# Patient Record
Sex: Male | Born: 2000 | Race: Black or African American | Hispanic: No | Marital: Single | Smoking: Never smoker
Health system: Southern US, Community
[De-identification: ages and names within clinical notes are randomized; demographics above are authoritative.]

## PROBLEM LIST (undated history)

## (undated) DIAGNOSIS — L309 Dermatitis, unspecified: Secondary | ICD-10-CM

## (undated) DIAGNOSIS — S83249A Other tear of medial meniscus, current injury, unspecified knee, initial encounter: Secondary | ICD-10-CM

## (undated) DIAGNOSIS — S83289A Other tear of lateral meniscus, current injury, unspecified knee, initial encounter: Secondary | ICD-10-CM

---
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
## 2019-12-02 ENCOUNTER — Encounter (HOSPITAL_BASED_OUTPATIENT_CLINIC_OR_DEPARTMENT_OTHER): Payer: Self-pay

## 2019-12-02 ENCOUNTER — Emergency Department (HOSPITAL_BASED_OUTPATIENT_CLINIC_OR_DEPARTMENT_OTHER)
Admission: EM | Admit: 2019-12-02 | Discharge: 2019-12-02 | Disposition: A | Discharge status: Home / Self Care | Payer: Federal, State, Local not specified - PPO | Attending: Emergency Medicine | Admitting: Emergency Medicine

## 2019-12-02 ENCOUNTER — Other Ambulatory Visit: Payer: Self-pay

## 2019-12-02 DIAGNOSIS — Y999 Unspecified external cause status: Secondary | ICD-10-CM | POA: Diagnosis not present

## 2019-12-02 DIAGNOSIS — Y92410 Unspecified street and highway as the place of occurrence of the external cause: Secondary | ICD-10-CM | POA: Diagnosis not present

## 2019-12-02 DIAGNOSIS — Z043 Encounter for examination and observation following other accident: Secondary | ICD-10-CM | POA: Insufficient documentation

## 2019-12-02 DIAGNOSIS — Y9389 Activity, other specified: Secondary | ICD-10-CM | POA: Diagnosis not present
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
## 2021-04-28 ENCOUNTER — Emergency Department (HOSPITAL_BASED_OUTPATIENT_CLINIC_OR_DEPARTMENT_OTHER): Payer: Federal, State, Local not specified - PPO

## 2021-04-28 ENCOUNTER — Other Ambulatory Visit: Payer: Self-pay

## 2021-04-28 ENCOUNTER — Encounter (HOSPITAL_BASED_OUTPATIENT_CLINIC_OR_DEPARTMENT_OTHER): Payer: Self-pay

## 2021-04-28 ENCOUNTER — Emergency Department (HOSPITAL_BASED_OUTPATIENT_CLINIC_OR_DEPARTMENT_OTHER)
Admission: EM | Admit: 2021-04-28 | Discharge: 2021-04-28 | Disposition: A | Discharge status: Home / Self Care | Payer: Federal, State, Local not specified - PPO | Attending: Emergency Medicine | Admitting: Emergency Medicine

## 2021-04-28 DIAGNOSIS — K5909 Other constipation: Secondary | ICD-10-CM | POA: Insufficient documentation

## 2021-04-28 DIAGNOSIS — R1012 Left upper quadrant pain: Secondary | ICD-10-CM | POA: Diagnosis not present

## 2021-04-28 DIAGNOSIS — R7401 Elevation of levels of liver transaminase levels: Secondary | ICD-10-CM | POA: Insufficient documentation

## 2021-04-28 DIAGNOSIS — R1013 Epigastric pain: Secondary | ICD-10-CM

## 2021-04-28 DIAGNOSIS — R7989 Other specified abnormal findings of blood chemistry: Secondary | ICD-10-CM | POA: Insufficient documentation

## 2021-04-28 LAB — URINALYSIS, ROUTINE W REFLEX MICROSCOPIC
Bilirubin Urine: NEGATIVE
Glucose, UA: NEGATIVE mg/dL
Hgb urine dipstick: NEGATIVE
Ketones, ur: NEGATIVE mg/dL
Leukocytes,Ua: NEGATIVE
Nitrite: NEGATIVE
Protein, ur: NEGATIVE mg/dL
Specific Gravity, Urine: >=1.03 (ref 1.005–1.030)
pH: 6 (ref 5.0–8.0)

## 2021-04-28 LAB — COMPREHENSIVE METABOLIC PANEL
ALT: 45 U/L — ABNORMAL HIGH (ref 0–44)
AST: 112 U/L — ABNORMAL HIGH (ref 15–41)
Albumin: 4.4 g/dL (ref 3.5–5.0)
Alkaline Phosphatase: 61 U/L (ref 38–126)
Anion gap: 8 (ref 5–15)
BUN: 27 mg/dL — ABNORMAL HIGH (ref 6–20)
CO2: 26 mmol/L (ref 22–32)
Calcium: 9.2 mg/dL (ref 8.9–10.3)
Chloride: 103 mmol/L (ref 98–111)
Creatinine, Ser: 1.36 mg/dL — ABNORMAL HIGH (ref 0.61–1.24)
GFR, Estimated: >60 mL/min (ref >60)
Glucose, Bld: 90 mg/dL (ref 70–99)
Potassium: 3.8 mmol/L (ref 3.5–5.1)
Sodium: 137 mmol/L (ref 135–145)
Total Bilirubin: 0.6 mg/dL (ref 0.3–1.2)
Total Protein: 7.5 g/dL (ref 6.5–8.1)

## 2021-04-28 LAB — LIPASE, BLOOD: Lipase: 44 U/L (ref 11–51)

## 2021-04-28 LAB — CBC
HCT: 42.5 % (ref 39.0–52.0)
Hemoglobin: 14 g/dL (ref 13.0–17.0)
MCH: 27.6 pg (ref 26.0–34.0)
MCHC: 32.9 g/dL (ref 30.0–36.0)
MCV: 83.7 fL (ref 80.0–100.0)
Platelets: 289 10*3/uL (ref 150–400)
RBC: 5.08 MIL/uL (ref 4.22–5.81)
RDW: 13.9 % (ref 11.5–15.5)
WBC: 8.2 10*3/uL (ref 4.0–10.5)
nRBC: 0 % (ref 0.0–0.2)

## 2021-04-28 MED ORDER — SUCRALFATE 1 G PO TABS: 1.0000 g | ORAL_TABLET | Freq: Three times a day (TID) | ORAL | 0 refills | Status: AC

## 2021-04-28 MED ORDER — OMEPRAZOLE 20 MG PO CPDR: 20.0000 mg | DELAYED_RELEASE_CAPSULE | Freq: Every day | ORAL | 0 refills | Status: AC

## 2021-04-28 MED ORDER — IOHEXOL 300 MG/ML  SOLN
100.0000 mL | Freq: Once | INTRAMUSCULAR | Status: AC | PRN
  Administered 2021-04-28: 22:00:00 100 mL via INTRAVENOUS

## 2021-04-28 NOTE — ED Triage Notes (Signed)
Pt c/o abd pain, stool "that looks like crushed up styrofoam" x 10 months-NAD-steady gait

## 2021-04-28 NOTE — ED Provider Notes (Signed)
[facility] EMERGENCY DEPARTMENT Provider Note   CSN: [id number] Arrival date & time: 04/28/21  1842     History Chief Complaint  Patient presents with   Abdominal Pain    Hunter Casey is a 20 y.o. male.  The history is provided by the patient and medical records. No language interpreter was used.  Abdominal Pain Pain location:  Epigastric and LUQ Pain quality: aching   Pain radiates to:  Epigastric region Pain severity:  Severe Onset quality:  Gradual Timing:  Intermittent Progression:  Waxing and waning Chronicity:  Chronic Context: not previous surgeries and not recent illness   Relieved by:  Nothing Worsened by:  Movement Associated symptoms: constipation   Associated symptoms: no anorexia, no chest pain, no chills, no cough, no diarrhea, no dysuria, no fatigue, no fever, no flatus, no nausea, no shortness of breath and no vomiting       Past Medical History:  Diagnosis Date   Eczema    Lateral meniscal tear 02/10/2017   right knee   Medial meniscus tear 02/10/2017   right knee    There are no problems to display for this patient.   Past Surgical History:  Procedure Laterality Date   KNEE ARTHROSCOPY WITH LATERAL MENISECTOMY Right 03/16/2017   Procedure: RIGHT KNEE ARTHROSCOPY WITH PARTIAL LATERAL MENISCECTOMY;  Surgeon: Bjorn Pippin, MD;  Location: [facility];  Service: Orthopedics;  Laterality: Right;       Family History  Problem Relation Age of Onset   Asthma Maternal Uncle     Social History   Tobacco Use   Smoking status: Never   Smokeless tobacco: Never  Vaping Use   Vaping Use: Never used  Substance Use Topics   Alcohol use: Yes    Comment: occ   Drug use: Yes    Types: Marijuana    Home Medications Prior to Admission medications   Not on File    Allergies    Patient has no known allergies.  Review of Systems   Review of Systems  Constitutional:  Negative for chills, fatigue and fever.  HENT:   Negative for congestion.   Respiratory:  Negative for cough, chest tightness and shortness of breath.   Cardiovascular:  Negative for chest pain and palpitations.  Gastrointestinal:  Positive for abdominal pain and constipation. Negative for abdominal distention, anorexia, diarrhea, flatus, nausea and vomiting.  Genitourinary:  Negative for dysuria.  Musculoskeletal:  Negative for back pain, neck pain and neck stiffness.  Skin:  Negative for rash and wound.  Neurological:  Negative for light-headedness and headaches.  Psychiatric/Behavioral:  Negative for agitation and confusion.    Physical Exam Updated Vital Signs BP 135/60    Pulse 64    Temp 98 F (36.7 C) (Oral)    Resp 16    Ht  (1.803 m)    Wt 84.4 kg    SpO2 100%    BMI 25.94 kg/m   Physical Exam Vitals and nursing note reviewed.  Constitutional:      General: He is not in acute distress.    Appearance: He is well-developed.  HENT:     Head: Normocephalic and atraumatic.  Eyes:     Conjunctiva/sclera: Conjunctivae normal.  Cardiovascular:     Rate and Rhythm: Normal rate and regular rhythm.     Heart sounds: No murmur heard. Pulmonary:     Effort: Pulmonary effort is normal. No respiratory distress.     Breath sounds: Normal breath sounds.  Abdominal:     General: Abdomen is flat. Bowel sounds are normal. There is no distension.     Palpations: Abdomen is soft.     Tenderness: There is abdominal tenderness in the epigastric area and left upper quadrant. There is no right CVA tenderness, left CVA tenderness, guarding or rebound.     Hernia: No hernia is present.  Musculoskeletal:        General: No swelling.     Cervical back: Neck supple.  Skin:    General: Skin is warm and dry.     Capillary Refill: Capillary refill takes less than 2 seconds.  Neurological:     General: No focal deficit present.     Mental Status: He is alert.  Psychiatric:        Mood and Affect: Mood normal.    ED Results / Procedures  / Treatments   Labs (all labs ordered are listed, but only abnormal results are displayed) Labs Reviewed  COMPREHENSIVE METABOLIC PANEL - Abnormal; Notable for the following components:      Result Value   BUN 27 (*)    Creatinine, Ser 1.36 (*)    AST 112 (*)    ALT 45 (*)    All other components within normal limits  LIPASE, BLOOD  CBC  URINALYSIS, ROUTINE W REFLEX MICROSCOPIC    EKG None  Radiology CT ABDOMEN PELVIS W CONTRAST  Result Date: 04/28/2021 CLINICAL DATA:  Abdominal pain EXAM: CT ABDOMEN AND PELVIS WITH CONTRAST TECHNIQUE: Multidetector CT imaging of the abdomen and pelvis was performed using the standard protocol following bolus administration of intravenous contrast. CONTRAST:  OMNIPAQUE IOHEXOL 300 MG/ML  SOLN COMPARISON:  None. FINDINGS: Lower chest: No acute abnormality. Hepatobiliary: No focal liver abnormality is seen. No gallstones, gallbladder wall thickening, or biliary dilatation. Pancreas: Unremarkable. No pancreatic ductal dilatation or surrounding inflammatory changes. Spleen: Normal in size without focal abnormality. Adrenals/Urinary Tract: Adrenal glands are within normal limits. Kidneys demonstrate a normal enhancement pattern bilaterally. No obstructive changes are seen. Bladder is partially distended. Stomach/Bowel: The appendix is within normal limits. No obstructive or inflammatory changes of the colon are seen. Stomach and small bowel are within normal limits. Vascular/Lymphatic: No significant vascular findings are present. No enlarged abdominal or pelvic lymph nodes. Reproductive: Prostate is unremarkable. Other: No abdominal wall hernia or abnormality. No abdominopelvic ascites. Musculoskeletal: No acute or significant osseous findings. IMPRESSION: No acute abnormality noted. Electronically Signed   By: Alcide Clever M.D.   On: 04/28/2021 22:26    Procedures Procedures   Medications Ordered in ED Medications  iohexol (OMNIPAQUE) 300 MG/ML  solution 100 mL (100 mLs Intravenous Contrast Given 04/28/21 2212)    ED Course  I have reviewed the triage vital signs and the nursing notes.  Pertinent labs & imaging results that were available during my care of the patient were reviewed by me and considered in my medical decision making (see chart for details).    MDM Rules/Calculators/A&P                          Hunter Casey is a 20 y.o. male with a past medical history significant for eczema who presents with waxing waning abdominal pain for several months but recent worsening with stool changes.  Patient reports that over the last few months he has been having abdominal pain that comes and goes and is primarily in his epigastric and left upper quadrant.  He reports the  pain is severe at times up to 8 out of 10 in severity.  He reports it is usually worsened with exertion and exercise and not typically after eating.  He reports no trauma.  He denies any diarrhea but reports chronic constipation that his stools appear strange and "like squished Styrofoam".  He denies any bleeding.  Denies any urinary changes.  Denies testicle or groin pain.  Denies history of hernias or previous abdominal surgeries.  He reports today the pain was so severe he wanted to come in for evaluation.  Denies nausea or vomiting and still eating and drinking well.  He says that he has been seen by a student health provider but has never been evaluated by physician for this worsening abdominal pain.  On exam, patient has some mild tenderness in the epigastric left upper quadrant area.  Lungs are clear and bowel sounds are appreciated.  Flanks nontender.  No rash seen.  No focal neurologic deficits.  Vital signs reassuring.  Had a shared decision made conversation with patient.  Patient was earlier today having 8 out of 10 abdominal pain that has been coming and going which is somewhat concerning.  He says the pain comes and goes and especially with exertion does not be  concerned about possible herniation or some other structural abnormality.  Patient is amenable to getting a CT scan to evaluate and have screening labs.  Patient's labs show elevation in liver function.  Patient reports he has not had alcohol recently or Tylenol.  We will get the CT scan however if work-up is reassuring, anticipate initiation of Prilosec and Carafate for possible gastritis type symptoms and have him follow-up with GI for further evaluation.  Anticipate reassessment after imaging.  10:56 PM CT imaging reassuring.  Patient is labs did show some LFT elevation and elevated creatinine however they were did not appear to be critically elevated.  Creatinine was 1.3 and LFTs were slightly elevated.  Given his symptoms in the left upper quadrant and epigastric area I do suspect more of a gastritis or GI cause of symptoms.  Patient does say that it might be affected by eating at times.  We will give the patient Prilosec and Carafate and have him follow-up with GI as well as a PCP.  Patient is agreeable to this plan given the CT imaging being reassuring.  Patient had no other questions or concerns and was discharged in good condition after reassuring work-up here.   Final Clinical Impression(s) / ED Diagnoses Final diagnoses:  Epigastric pain    Rx / DC Orders ED Discharge Orders          Ordered    omeprazole (PRILOSEC) 20 MG capsule  Daily        04/28/21 2258    sucralfate (CARAFATE) 1 g tablet  3 times daily with meals & bedtime        04/28/21 2258            Clinical Impression: 1. Epigastric pain     Disposition: Discharge  Condition: Good  I have discussed the results, Dx and Tx plan with the pt(& family if present). He/she/they expressed understanding and agree(s) with the plan. Discharge instructions discussed at great length. Strict return precautions discussed and pt &/or family have verbalized understanding of the instructions. No further questions at  time of discharge.    New Prescriptions   OMEPRAZOLE (PRILOSEC) 20 MG CAPSULE    Take 1 capsule (20 mg total) by mouth daily.  SUCRALFATE (CARAFATE) 1 G TABLET    Take 1 tablet (1 g total) by mouth 4 (four) times daily -  with meals and at bedtime.    Follow Up: Suzanna Obey, DO [facility] [address] [place] [address] [phone]     [facility] [address] [place] [address] [phone]    Gastroenterology, Deboraha Sprang [address] [place] [address] [place] [address] [phone]        Yoniel Arkwright, Canary Brim, MD 04/28/21 [phone]

## 2021-04-28 NOTE — Discharge Instructions (Signed)
Your history, exam, work-up today were overall reassuring.  He did have some slight elevation in your liver function and creatinine which I suspect may be due to some dehydration.  Your CT imaging was reassuring with no evidence of gallbladder problems or other concerning finding.  Given your resolution of symptoms here it is reasonable to let you go home however given the location of discomfort, please start taking the Prilosec and Carafate to help with discomfort and follow-up with gastroenterology.  I included both [facility] and [facility] to try to get the closest follow-up.  Please rest and stay hydrated.  If any symptoms change or worsen, please turn to the nearest emergency department.

## 2021-04-28 NOTE — ED Notes (Signed)
Patient reminded of need for urine sample.  Attempting to provide sample at this time

## 2022-04-10 ENCOUNTER — Emergency Department (HOSPITAL_COMMUNITY)
Admission: EM | Admit: 2022-04-10 | Discharge: 2022-04-11 | Discharge status: Against Medical Advice | Payer: 59 | Attending: Emergency Medicine | Admitting: Emergency Medicine

## 2022-04-10 ENCOUNTER — Encounter (HOSPITAL_COMMUNITY): Payer: Self-pay | Admitting: *Deleted

## 2022-04-10 ENCOUNTER — Emergency Department (HOSPITAL_COMMUNITY): Payer: 59

## 2022-04-10 ENCOUNTER — Other Ambulatory Visit: Payer: Self-pay

## 2022-04-10 DIAGNOSIS — S61213A Laceration without foreign body of left middle finger without damage to nail, initial encounter: Secondary | ICD-10-CM | POA: Insufficient documentation

## 2022-04-10 DIAGNOSIS — W268XXA Contact with other sharp object(s), not elsewhere classified, initial encounter: Secondary | ICD-10-CM | POA: Diagnosis not present

## 2022-04-10 DIAGNOSIS — Z5321 Procedure and treatment not carried out due to patient leaving prior to being seen by health care provider: Secondary | ICD-10-CM | POA: Insufficient documentation
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
## 2022-04-10 NOTE — ED Triage Notes (Signed)
Laceration to his lt middle finger on a piece of metal approx 30 minutes ago  bandaged no active bleeding

## 2022-04-11 NOTE — ED Notes (Signed)
Called pt x3 for room, no response. 

## 2022-04-11 NOTE — ED Notes (Signed)
Jane(EMT) went around lobby checking vitals this morning and pt was not located.

## 2022-12-19 IMAGING — CT CT ABD-PELV W/ CM
2 of 4 series · 16 of 46 positions shown, 18 images · IV contrast (Omnipaque)
Comparison: None.

CLINICAL DATA: Abdominal pain

EXAM:
CT ABDOMEN AND PELVIS WITH CONTRAST
TECHNIQUE: Multidetector CT imaging of the abdomen and pelvis was performed
using the standard protocol following bolus administration of
intravenous contrast.
CONTRAST:  100mL OMNIPAQUE IOHEXOL 300 MG/ML  SOLN

[Series 2: axial st · axial · 0.98mm/px · z∈[-775,-370]mm · 13 of 89 slices shown, 15 images]
[im 4/89  soft-tissue]
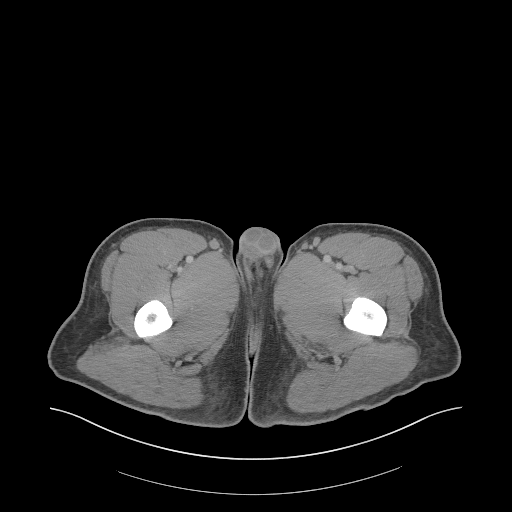
[im 4/89  bone]
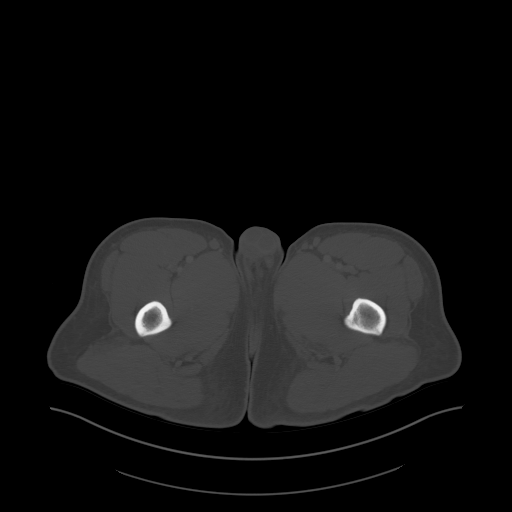
[im 11/89  soft-tissue]
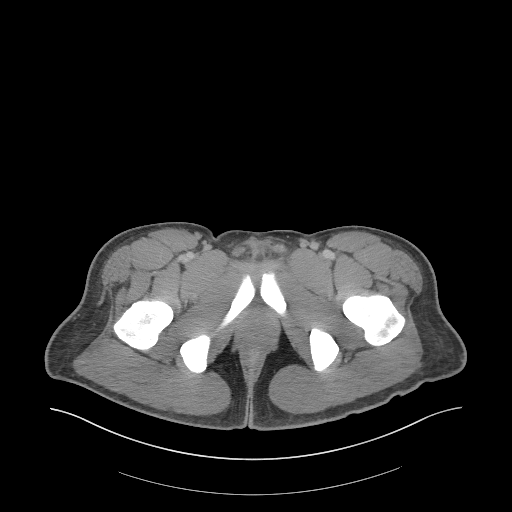
[im 17/89  soft-tissue]
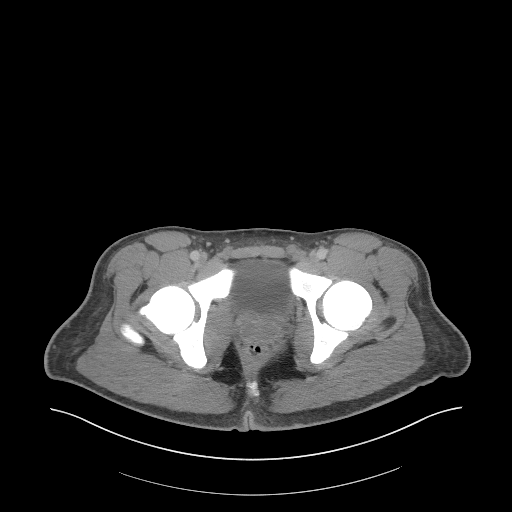
[im 24/89  soft-tissue]
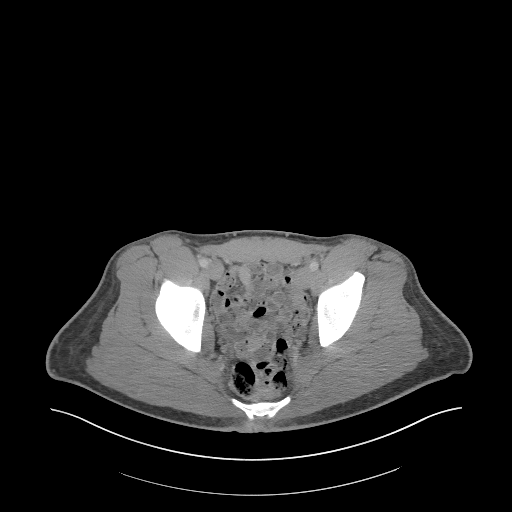
[im 31/89  soft-tissue]
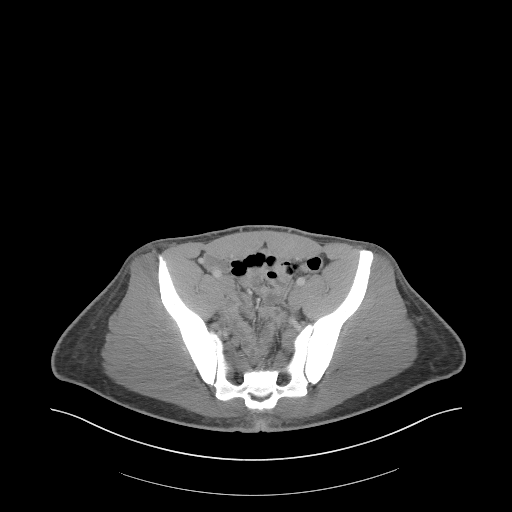
[im 38/89  soft-tissue]
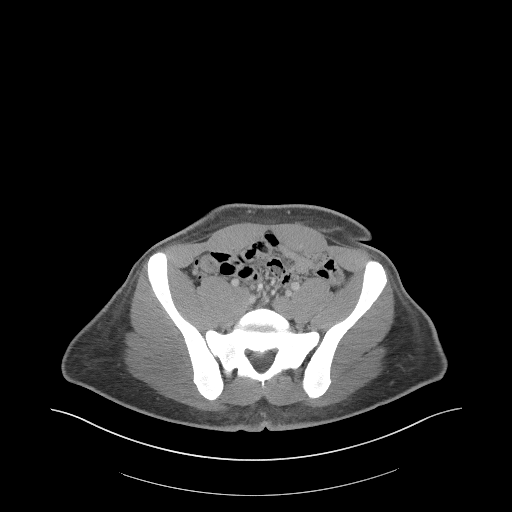
[im 45/89  soft-tissue]
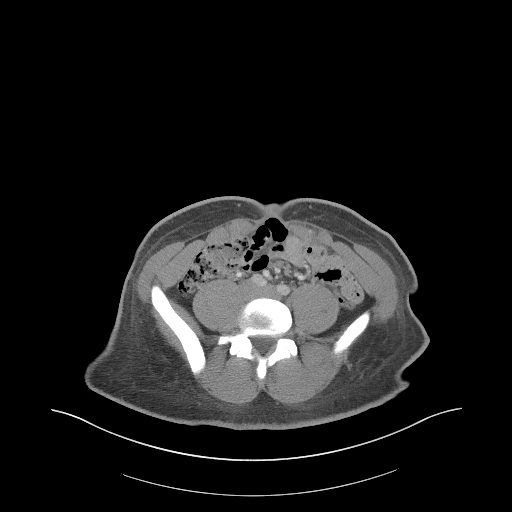
[im 51/89  soft-tissue]
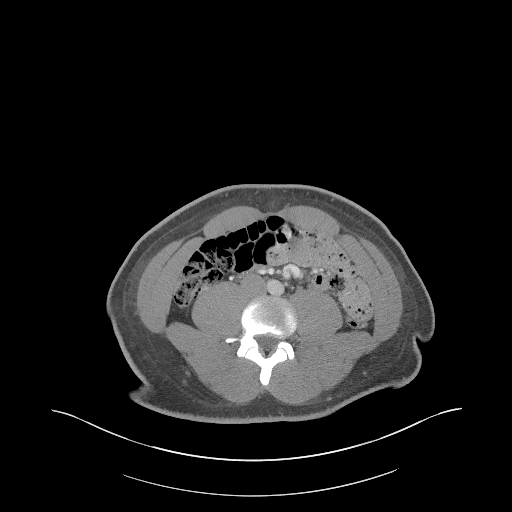
[im 58/89  soft-tissue]
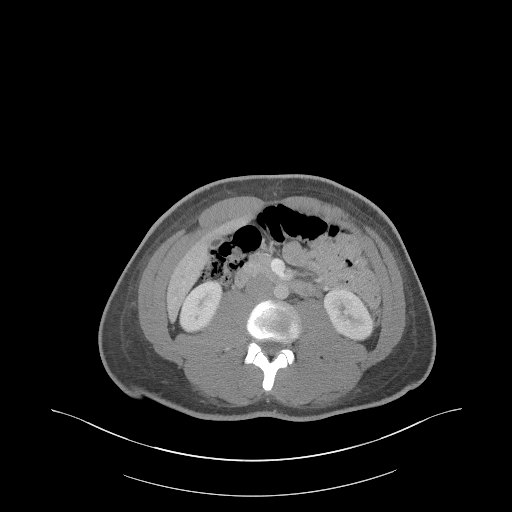
[im 58/89  bone]
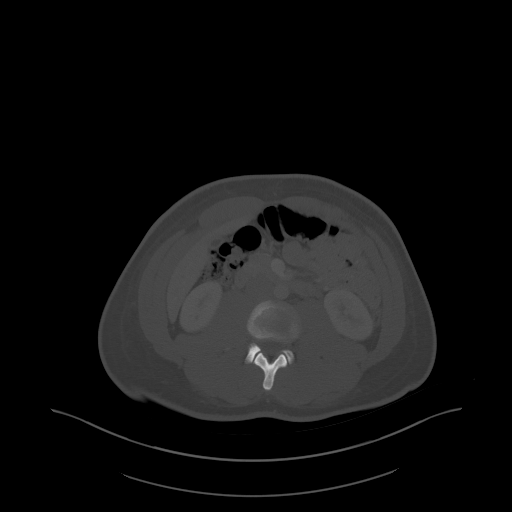
[im 65/89  soft-tissue]
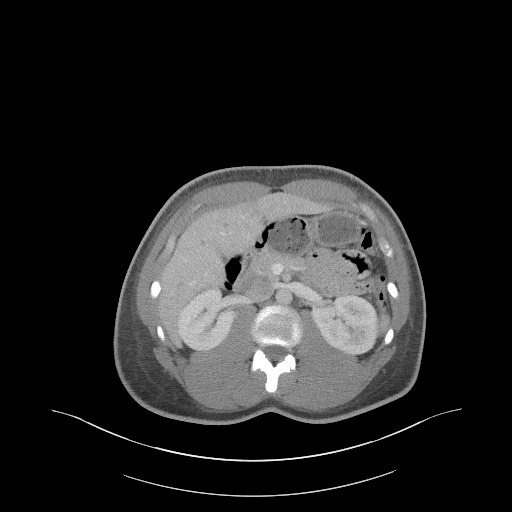
[im 72/89  soft-tissue]
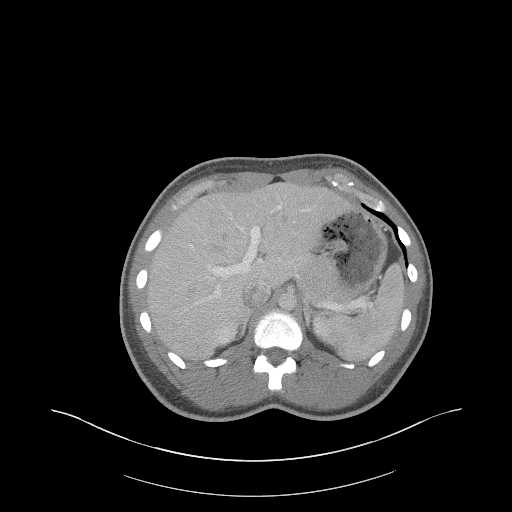
[im 78/89  soft-tissue]
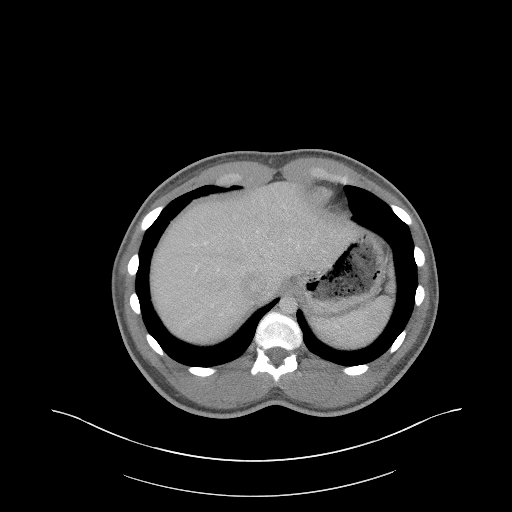
[im 85/89  soft-tissue]
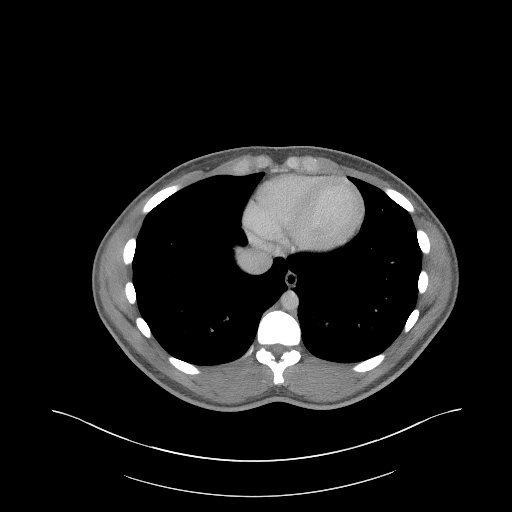

[Series 5: coronal st · coronal · 0.72mm/px · 3 of 92 slices shown]
[im 31/92  soft-tissue]
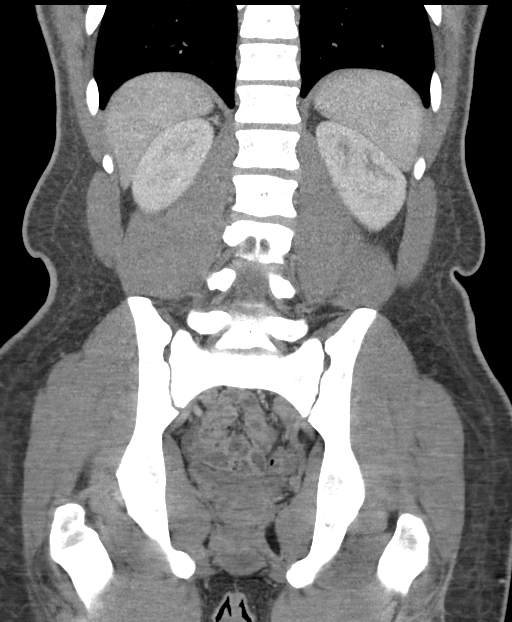
[im 41/92  soft-tissue]
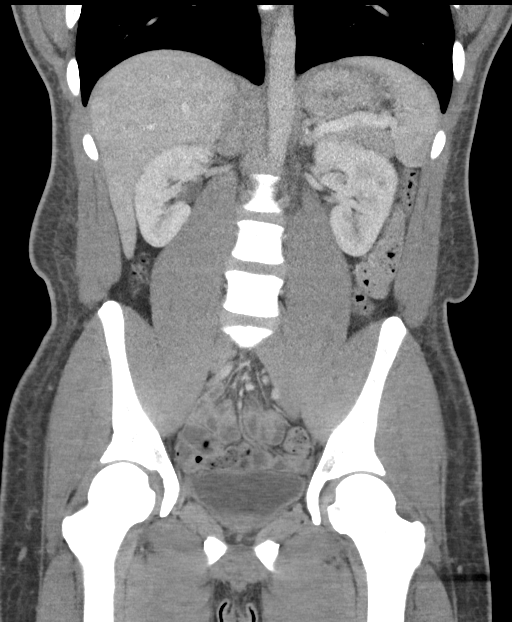
[im 51/92  soft-tissue]
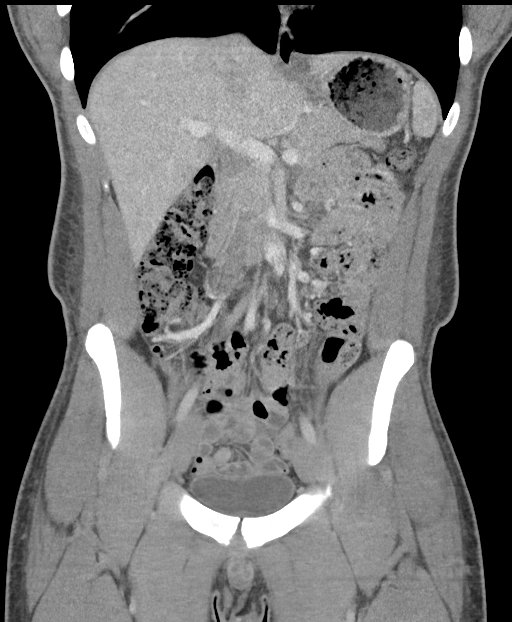

[16 of 46 positions shown; findings below may reference images not displayed]

FINDINGS: Lower chest: No acute abnormality.

Hepatobiliary: No focal liver abnormality is seen. No gallstones,
gallbladder wall thickening, or biliary dilatation.

Pancreas: Unremarkable. No pancreatic ductal dilatation or
surrounding inflammatory changes.

Spleen: Normal in size without focal abnormality.

Adrenals/Urinary Tract: Adrenal glands are within normal limits.
Kidneys demonstrate a normal enhancement pattern bilaterally. No
obstructive changes are seen. Bladder is partially distended.

Stomach/Bowel: The appendix is within normal limits. No obstructive
or inflammatory changes of the colon are seen. Stomach and small
bowel are within normal limits.

Vascular/Lymphatic: No significant vascular findings are present. No
enlarged abdominal or pelvic lymph nodes.

Reproductive: Prostate is unremarkable.

Other: No abdominal wall hernia or abnormality. No abdominopelvic
ascites.

Musculoskeletal: No acute or significant osseous findings.
IMPRESSION: No acute abnormality noted.
